# Patient Record
Sex: Male | Born: 1985 | Race: Black or African American | Hispanic: Yes | Marital: Single
Health system: Southern US, Community
[De-identification: ages and names within clinical notes are randomized; demographics above are authoritative.]

## PROBLEM LIST (undated history)

## (undated) HISTORY — PX: HERNIA REPAIR: SHX51

---

## 2019-07-13 ENCOUNTER — Encounter (HOSPITAL_COMMUNITY): Payer: Self-pay

## 2019-07-13 ENCOUNTER — Emergency Department (HOSPITAL_COMMUNITY)
Admission: EM | Admit: 2019-07-13 | Discharge: 2019-07-15 | Disposition: A | Discharge status: Home / Self Care | Payer: Self-pay | Attending: Emergency Medicine | Admitting: Emergency Medicine

## 2019-07-13 ENCOUNTER — Emergency Department (HOSPITAL_COMMUNITY): Payer: Self-pay

## 2019-07-13 ENCOUNTER — Other Ambulatory Visit: Payer: Self-pay

## 2019-07-13 ENCOUNTER — Emergency Department (HOSPITAL_COMMUNITY)
Admission: EM | Admit: 2019-07-13 | Discharge: 2019-07-13 | Discharge status: Against Medical Advice | Payer: Self-pay | Attending: Emergency Medicine | Admitting: Emergency Medicine

## 2019-07-13 DIAGNOSIS — F1994 Other psychoactive substance use, unspecified with psychoactive substance-induced mood disorder: Secondary | ICD-10-CM | POA: Insufficient documentation

## 2019-07-13 DIAGNOSIS — Z5320 Procedure and treatment not carried out because of patient's decision for unspecified reasons: Secondary | ICD-10-CM | POA: Insufficient documentation

## 2019-07-13 DIAGNOSIS — F19959 Other psychoactive substance use, unspecified with psychoactive substance-induced psychotic disorder, unspecified: Secondary | ICD-10-CM

## 2019-07-13 DIAGNOSIS — N179 Acute kidney failure, unspecified: Secondary | ICD-10-CM | POA: Insufficient documentation

## 2019-07-13 DIAGNOSIS — Z046 Encounter for general psychiatric examination, requested by authority: Secondary | ICD-10-CM | POA: Insufficient documentation

## 2019-07-13 DIAGNOSIS — R443 Hallucinations, unspecified: Secondary | ICD-10-CM | POA: Insufficient documentation

## 2019-07-13 DIAGNOSIS — Z20828 Contact with and (suspected) exposure to other viral communicable diseases: Secondary | ICD-10-CM | POA: Insufficient documentation

## 2019-07-13 DIAGNOSIS — F919 Conduct disorder, unspecified: Secondary | ICD-10-CM

## 2019-07-13 DIAGNOSIS — L02512 Cutaneous abscess of left hand: Secondary | ICD-10-CM | POA: Insufficient documentation

## 2019-07-13 DIAGNOSIS — F1910 Other psychoactive substance abuse, uncomplicated: Secondary | ICD-10-CM

## 2019-07-13 DIAGNOSIS — R509 Fever, unspecified: Secondary | ICD-10-CM | POA: Insufficient documentation

## 2019-07-13 DIAGNOSIS — F15259 Other stimulant dependence with stimulant-induced psychotic disorder, unspecified: Secondary | ICD-10-CM | POA: Insufficient documentation

## 2019-07-13 DIAGNOSIS — F1510 Other stimulant abuse, uncomplicated: Secondary | ICD-10-CM

## 2019-07-13 DIAGNOSIS — R451 Restlessness and agitation: Secondary | ICD-10-CM

## 2019-07-13 LAB — CBC WITH DIFFERENTIAL/PLATELET
Abs Immature Granulocytes: 0.09 10*3/uL — ABNORMAL HIGH (ref 0.00–0.07)
Basophils Absolute: 0 10*3/uL (ref 0.0–0.1)
Basophils Relative: 0 %
Eosinophils Absolute: 0 10*3/uL (ref 0.0–0.5)
Eosinophils Relative: 0 %
HCT: 36.2 % — ABNORMAL LOW (ref 39.0–52.0)
Hemoglobin: 12 g/dL — ABNORMAL LOW (ref 13.0–17.0)
Immature Granulocytes: 1 %
Lymphocytes Relative: 3 %
Lymphs Abs: 0.4 10*3/uL — ABNORMAL LOW (ref 0.7–4.0)
MCH: 32.7 pg (ref 26.0–34.0)
MCHC: 33.1 g/dL (ref 30.0–36.0)
MCV: 98.6 fL (ref 80.0–100.0)
Monocytes Absolute: 0.5 10*3/uL (ref 0.1–1.0)
Monocytes Relative: 4 %
Neutro Abs: 13 10*3/uL — ABNORMAL HIGH (ref 1.7–7.7)
Neutrophils Relative %: 92 %
Platelets: 249 10*3/uL (ref 150–400)
RBC: 3.67 MIL/uL — ABNORMAL LOW (ref 4.22–5.81)
RDW: 14.9 % (ref 11.5–15.5)
WBC: 14.1 10*3/uL — ABNORMAL HIGH (ref 4.0–10.5)
nRBC: 0 % (ref 0.0–0.2)

## 2019-07-13 LAB — COMPREHENSIVE METABOLIC PANEL
ALT: 42 U/L (ref 0–44)
AST: 117 U/L — ABNORMAL HIGH (ref 15–41)
Albumin: 4.8 g/dL (ref 3.5–5.0)
Alkaline Phosphatase: 55 U/L (ref 38–126)
Anion gap: 15 (ref 5–15)
BUN: 28 mg/dL — ABNORMAL HIGH (ref 6–20)
CO2: 20 mmol/L — ABNORMAL LOW (ref 22–32)
Calcium: 9.6 mg/dL (ref 8.9–10.3)
Chloride: 103 mmol/L (ref 98–111)
Creatinine, Ser: 2.39 mg/dL — ABNORMAL HIGH (ref 0.61–1.24)
GFR calc Af Amer: 40 mL/min — ABNORMAL LOW (ref >60)
GFR calc non Af Amer: 34 mL/min — ABNORMAL LOW (ref >60)
Glucose, Bld: 103 mg/dL — ABNORMAL HIGH (ref 70–99)
Potassium: 3.7 mmol/L (ref 3.5–5.1)
Sodium: 138 mmol/L (ref 135–145)
Total Bilirubin: 0.9 mg/dL (ref 0.3–1.2)
Total Protein: 8.6 g/dL — ABNORMAL HIGH (ref 6.5–8.1)

## 2019-07-13 LAB — ETHANOL: Alcohol, Ethyl (B): <10 mg/dL (ref <10)

## 2019-07-13 LAB — ACETAMINOPHEN LEVEL: Acetaminophen (Tylenol), Serum: <10 ug/mL — ABNORMAL LOW (ref 10–30)

## 2019-07-13 LAB — SALICYLATE LEVEL: Salicylate Lvl: <7 mg/dL (ref 2.8–30.0)

## 2019-07-13 MED ORDER — LORAZEPAM 2 MG/ML IJ SOLN
INTRAMUSCULAR | Status: AC
  Filled 2019-07-13: qty 1

## 2019-07-13 MED ORDER — LORAZEPAM 2 MG/ML IJ SOLN
1.0000 mg | Freq: Once | INTRAMUSCULAR | Status: AC
  Administered 2019-07-13: 1 mg via INTRAVENOUS
  Filled 2019-07-13: qty 1

## 2019-07-13 MED ORDER — SODIUM CHLORIDE 0.9 % IV BOLUS
1000.0000 mL | Freq: Once | INTRAVENOUS | Status: AC
  Administered 2019-07-13: 1000 mL via INTRAVENOUS

## 2019-07-13 MED ORDER — STERILE WATER FOR INJECTION IJ SOLN
INTRAMUSCULAR | Status: AC
  Filled 2019-07-13: qty 10

## 2019-07-13 MED ORDER — ZIPRASIDONE MESYLATE 20 MG IM SOLR
10.0000 mg | Freq: Once | INTRAMUSCULAR | Status: AC
  Administered 2019-07-13: 10 mg via INTRAMUSCULAR
  Filled 2019-07-13: qty 20

## 2019-07-13 NOTE — ED Notes (Signed)
Pt given lunch tray at this time

## 2019-07-13 NOTE — Discharge Instructions (Addendum)
Your history and exam today were overall reassuring aside from the hallucinations we feel you are having due to your meth use.  After you are able to eat and drink and were feeling better, we feel you are safe for discharge home.  We offered you to go see the psychiatry across the street but she did not want to do so.  Please follow-up with the outpatient psychiatry team and return if any symptoms change or worsen.
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
## 2019-07-13 NOTE — ED Notes (Signed)
TTS machine in room ?

## 2019-07-13 NOTE — [facility] Counselor (Addendum)
Contacted [facility] to initiate telepsych  @1238 - Telepsych not set up. Contacted [facility] ED secretary who states someone will put machine in room.  @1255 - Continue to be unable to reach telepsych.  @1300 - Continue to be unable to reach telepsych. Please contact [facility] when ready for assessment. TTS to go to next patient.

## 2019-07-13 NOTE — ED Notes (Addendum)
Pt refusing chest XR saying "fuck no. Get that machine away from" RN explained purpose of exam and he said "fuck no. I'll blow up your ass"

## 2019-07-13 NOTE — ED Notes (Signed)
Pt made contact with his girlfriend by phone so they are aware of his location. He is from [place] and came down here Friday night with a friend.
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
## 2019-07-13 NOTE — BH Assessment (Addendum)
Shuvon Rankin, NP recommends patient be transferred to [facility] Observation unit and started on Zyprexa 5 mg qhs. Agricultural consultant, Optician, dispensing informed.  [facility] will not IVC patient if he wants to sign out AMA as he does not meet criteria.  Per Nicoletta Dress, Observation Unit RN patient may come after 1600. Accepting Provider is Earleen Newport, NP. Please call report to [phone].

## 2019-07-13 NOTE — ED Notes (Signed)
Pt refuses to provide urine. He is uncooperative and cursing at staff.

## 2019-07-13 NOTE — ED Notes (Signed)
Staff attempted to get urine from pt. Pt not cooperating. Pt yelling and cursing at staff that he cannot give it without water.

## 2019-07-13 NOTE — ED Notes (Addendum)
Pt says to RN "I am never doing meth again this is crazy". Pt very is very jumpy and anxious, but is easily directed. Pt scratches at arms so a blanket was put over him.  RN put patient on monitor and tool vitals. Will continue to monitor.

## 2019-07-13 NOTE — ED Notes (Signed)
Attempted to start IVF on pt. Pt began yelling, "fuck off bitch, don't hook me up to anything." Pt then bit into IV tubing. Will attempt to hook pt up to fluids once pt calms down.
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
## 2019-07-13 NOTE — ED Notes (Signed)
Pt yelled at staff "just shut up and listen. You don't know shit. Fuck. Do you want to die." Pt becoming more agitated and yelling at staff. Multiple staff and [employer] attempted to use deescalating techniques without success.
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
## 2019-07-13 NOTE — BH Assessment (Signed)
Tele Assessment Note   Patient Name: Geoffrey Walters MRN: [id number] Referring Physician: Tegeler Location of Patient: [facility] ED Location of Provider: Behavioral Health TTS Department  Brando Taves is an 33 y.o. male presenting to [facility] ED via EMS after police called out because patient trying to get into houses and cars. He is a poor historian due to AMS. Patient reports using methamphetamine. Upon this clinician's exam patient states he is in the hospital because "I did too much drugs." He reports using methamphetamine for 2 days and has not slept. He denies any other substance use. He denies SI/HI. He denies AVH when not on meth. During assessment process patient states that his father is in the room with him and will take him home. Patient is alone in room. He denies any outpatient mental health services or prior hospitalizations. He states that he lives in [place] and attends NA and AA meetings there. No labs were collected on patient so it unclear if he is under the influence of any other substances.   Patient is alert and oriented x 3. He is laying in hospital bed and appears bizarre. His speech is rapid and pressured, his eye contact is fair, and his thoughts are somewhat disorganized. His mood is anxious and his affect is congruent. He has poor insight, judgement, and impulse control. He appears acutely psychotic.  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 No pertinent past medical history.  Past Surgical History:  Procedure Laterality Date  . HERNIA REPAIR      Family History: No family history on file.  Social History:  reports current drug use. Drug: Methamphetamines. No history on file for tobacco and alcohol.  Additional Social History:  Alcohol / Drug Use Pain Medications: see MAR Prescriptions: see MAR Over the Counter: see MAR History of alcohol / drug use?: Yes Substance #1 Name of Substance 1:  Methamphetamine 1 - Age of First Use: UTA 1 - Amount (size/oz): UTA 1 - Frequency: UTA 1 - Duration: UTA 1 - Last Use / Amount: 07/13/2019 AM  CIWA: CIWA-Ar BP: 133/85 Pulse Rate: (!) 102 COWS:    Allergies: No Known Allergies  Home Medications: (Not in a hospital admission)   OB/GYN Status:  No LMP for male patient.  General Assessment Data Assessment unable to be completed: Yes Reason for not completing assessment: (counselor made multiple attempts to set up telepsych) Location of Assessment: [facility] TTS Assessment: In system Is this a Tele or Face-to-Face Assessment?: Tele Assessment Is this an Initial Assessment or a Re-assessment for this encounter?: Initial Assessment Patient Accompanied by:: N/A Language Other than English: No Living Arrangements: (in [place]) What gender do you identify as?: Male Marital status: Single Maiden name: Harju Pregnancy Status: No Living Arrangements: Alone Can pt return to current living arrangement?: Yes Admission Status: Voluntary Is patient capable of signing voluntary admission?: Yes Referral Source: Self/Family/Friend Insurance type: None     Crisis Care Plan Living Arrangements: Alone Legal Guardian: (self) Name of Psychiatrist: none Name of Therapist: none  Education Status Is patient currently in school?: No Is the patient employed, unemployed or receiving disability?: (UTA)  Risk to self with the past 6 months Suicidal Ideation: No Has patient been a risk to self within the past 6 months prior to admission? : No Suicidal Intent: No Has patient had any suicidal intent within the past 6 months prior to admission? : No Is patient at risk for suicide?: No Suicidal  Plan?: No Has patient had any suicidal plan within the past 6 months prior to admission? : No Access to Means: No What has been your use of drugs/alcohol within the last 12 months?: methamphetamine Previous Attempts/Gestures: No How many times?:  0 Other Self Harm Risks: none Triggers for Past Attempts: None known Intentional Self Injurious Behavior: None Family Suicide History: No Recent stressful life event(s): (none noted) Persecutory voices/beliefs?: No Depression: Yes Depression Symptoms: Insomnia, Feeling angry/irritable, Feeling worthless/self pity, Isolating Substance abuse history and/or treatment for substance abuse?: No Suicide prevention information given to non-admitted patients: Not applicable  Risk to Others within the past 6 months Homicidal Ideation: No Does patient have any lifetime risk of violence toward others beyond the six months prior to admission? : No Thoughts of Harm to Others: No Current Homicidal Intent: No Current Homicidal Plan: No Access to Homicidal Means: No Identified Victim: none History of harm to others?: No Assessment of Violence: None Noted Violent Behavior Description: none Does patient have access to weapons?: No Criminal Charges Pending?: No Does patient have a court date: No Is patient on probation?: No  Psychosis Hallucinations: Visual Delusions: None noted  Mental Status Report Appearance/Hygiene: In scrubs Eye Contact: Fair Motor Activity: Freedom of movement Speech: Rapid, Pressured Level of Consciousness: Alert Mood: Irritable Affect: Irritable Anxiety Level: Moderate Thought Processes: Circumstantial Judgement: Impaired Orientation: Person, Time, Place, Situation Obsessive Compulsive Thoughts/Behaviors: None  Cognitive Functioning Concentration: Normal Memory: Recent Intact, Remote Intact Is patient IDD: No Insight: Poor Impulse Control: Poor Appetite: Poor Have you had any weight changes? : No Change Sleep: Decreased Total Hours of Sleep: 0 Vegetative Symptoms: None  ADLScreening [facility]) Patient's cognitive ability adequate to safely complete daily activities?: Yes Patient able to express need for assistance with ADLs?:  Yes Independently performs ADLs?: Yes (appropriate for developmental age)  Prior Inpatient Therapy Prior Inpatient Therapy: No  Prior Outpatient Therapy Prior Outpatient Therapy: No Does patient have an ACCT team?: No Does patient have Intensive In-House Services?  : No Does patient have [facility] services? : No Does patient have P4CC services?: No  ADL Screening (condition at time of admission) Patient's cognitive ability adequate to safely complete daily activities?: Yes Is the patient deaf or have difficulty hearing?: No Does the patient have difficulty seeing, even when wearing glasses/contacts?: No Does the patient have difficulty concentrating, remembering, or making decisions?: No Patient able to express need for assistance with ADLs?: Yes Does the patient have difficulty dressing or bathing?: No Independently performs ADLs?: Yes (appropriate for developmental age) Does the patient have difficulty walking or climbing stairs?: No Weakness of Legs: None Weakness of Arms/Hands: None  Home Assistive Devices/Equipment Home Assistive Devices/Equipment: None  Therapy Consults (therapy consults require a physician order) PT Evaluation Needed: No OT Evalulation Needed: No SLP Evaluation Needed: No Abuse/Neglect Assessment (Assessment to be complete while patient is alone) Abuse/Neglect Assessment Can Be Completed: Unable to assess, patient is non-responsive or altered mental status Values / Beliefs Cultural Requests During Hospitalization: None Spiritual Requests During Hospitalization: None Consults Spiritual Care Consult Needed: No Social Work Consult Needed: No Regulatory affairs officer (For Healthcare) Does Patient Have a Medical Advance Directive?: Unable to assess, patient is non-responsive or altered mental status Would patient like information on creating a medical advance directive?: No - Patient declined          Disposition: Shuvon Rankin, NP recommends patient be  observed overnight of safety and stabilization and started in 5 mg Zyprexa qhs. Agricultural consultant  informed. Patient may come to observation unit after 1600 pending negative Covid. Disposition Initial Assessment Completed for this Encounter: Yes  This service was provided via telemedicine using a 2-way, interactive audio and video technology.  Names of all persons participating in this telemedicine service and their role in this encounter. Name: Wandra MannanBrandon Ceesay Role: patient  Name: Celedonio MiyamotoMeredith Shaquile Lutze, LCSW Role: TTS  Name:  Role:   Name:  Role:     Celedonio MiyamotoMeredith  Stacy Deshler 07/13/2019 2:20 PM

## 2019-07-13 NOTE — ED Triage Notes (Signed)
Patient arrived via [facility] after [employer] called out for someone trying to get into a house/cars. Patient reports smoking meth and confused about date and location. Reports some hallucinations. Denies SI or HI.

## 2019-07-13 NOTE — ED Notes (Signed)
Pt left AMA.  Pt alert and ambulatory without difficulty.

## 2019-07-14 ENCOUNTER — Emergency Department (HOSPITAL_COMMUNITY): Payer: Self-pay

## 2019-07-14 ENCOUNTER — Other Ambulatory Visit: Payer: Self-pay

## 2019-07-14 ENCOUNTER — Encounter (HOSPITAL_COMMUNITY): Payer: Self-pay | Admitting: Behavioral Health

## 2019-07-14 DIAGNOSIS — F19959 Other psychoactive substance use, unspecified with psychoactive substance-induced psychotic disorder, unspecified: Secondary | ICD-10-CM

## 2019-07-14 LAB — CBC WITH DIFFERENTIAL/PLATELET
Abs Immature Granulocytes: 0.04 10*3/uL (ref 0.00–0.07)
Basophils Absolute: 0 10*3/uL (ref 0.0–0.1)
Basophils Relative: 0 %
Eosinophils Absolute: 0 10*3/uL (ref 0.0–0.5)
Eosinophils Relative: 0 %
HCT: 30.6 % — ABNORMAL LOW (ref 39.0–52.0)
Hemoglobin: 9.9 g/dL — ABNORMAL LOW (ref 13.0–17.0)
Immature Granulocytes: 1 %
Lymphocytes Relative: 6 %
Lymphs Abs: 0.5 10*3/uL — ABNORMAL LOW (ref 0.7–4.0)
MCH: 32.5 pg (ref 26.0–34.0)
MCHC: 32.4 g/dL (ref 30.0–36.0)
MCV: 100.3 fL — ABNORMAL HIGH (ref 80.0–100.0)
Monocytes Absolute: 0.5 10*3/uL (ref 0.1–1.0)
Monocytes Relative: 5 %
Neutro Abs: 7.8 10*3/uL — ABNORMAL HIGH (ref 1.7–7.7)
Neutrophils Relative %: 88 %
Platelets: 174 10*3/uL (ref 150–400)
RBC: 3.05 MIL/uL — ABNORMAL LOW (ref 4.22–5.81)
RDW: 15.2 % (ref 11.5–15.5)
WBC: 8.8 10*3/uL (ref 4.0–10.5)
nRBC: 0 % (ref 0.0–0.2)

## 2019-07-14 LAB — CK
Total CK: 4909 U/L — ABNORMAL HIGH (ref 49–397)
Total CK: 3898 U/L — ABNORMAL HIGH (ref 49–397)
Total CK: 2264 U/L — ABNORMAL HIGH (ref 49–397)

## 2019-07-14 LAB — RAPID URINE DRUG SCREEN, HOSP PERFORMED
Amphetamines: POSITIVE — AB
Barbiturates: NOT DETECTED
Benzodiazepines: NOT DETECTED
Cocaine: POSITIVE — AB
Opiates: NOT DETECTED
Tetrahydrocannabinol: POSITIVE — AB

## 2019-07-14 LAB — BASIC METABOLIC PANEL
Anion gap: 9 (ref 5–15)
BUN: 22 mg/dL — ABNORMAL HIGH (ref 6–20)
CO2: 20 mmol/L — ABNORMAL LOW (ref 22–32)
Calcium: 8.1 mg/dL — ABNORMAL LOW (ref 8.9–10.3)
Chloride: 109 mmol/L (ref 98–111)
Creatinine, Ser: 1.53 mg/dL — ABNORMAL HIGH (ref 0.61–1.24)
GFR calc Af Amer: >60 mL/min (ref >60)
GFR calc non Af Amer: 58 mL/min — ABNORMAL LOW (ref >60)
Glucose, Bld: 83 mg/dL (ref 70–99)
Potassium: 3.7 mmol/L (ref 3.5–5.1)
Sodium: 138 mmol/L (ref 135–145)

## 2019-07-14 LAB — COMPREHENSIVE METABOLIC PANEL
ALT: 36 U/L (ref 0–44)
AST: 71 U/L — ABNORMAL HIGH (ref 15–41)
Albumin: 3.6 g/dL (ref 3.5–5.0)
Alkaline Phosphatase: 43 U/L (ref 38–126)
Anion gap: 10 (ref 5–15)
BUN: 15 mg/dL (ref 6–20)
CO2: 21 mmol/L — ABNORMAL LOW (ref 22–32)
Calcium: 8.3 mg/dL — ABNORMAL LOW (ref 8.9–10.3)
Chloride: 106 mmol/L (ref 98–111)
Creatinine, Ser: 1.18 mg/dL (ref 0.61–1.24)
GFR calc Af Amer: >60 mL/min (ref >60)
GFR calc non Af Amer: >60 mL/min (ref >60)
Glucose, Bld: 113 mg/dL — ABNORMAL HIGH (ref 70–99)
Potassium: 3.5 mmol/L (ref 3.5–5.1)
Sodium: 137 mmol/L (ref 135–145)
Total Bilirubin: 0.4 mg/dL (ref 0.3–1.2)
Total Protein: 6.8 g/dL (ref 6.5–8.1)

## 2019-07-14 LAB — URINALYSIS, ROUTINE W REFLEX MICROSCOPIC: RBC / HPF: >50 RBC/hpf — ABNORMAL HIGH (ref 0–5)

## 2019-07-14 LAB — SARS CORONAVIRUS 2 (TAT 6-24 HRS): SARS Coronavirus 2: NEGATIVE

## 2019-07-14 LAB — APTT: aPTT: 28 seconds (ref 24–36)

## 2019-07-14 LAB — PROTIME-INR
INR: 1 (ref 0.8–1.2)
Prothrombin Time: 13.5 seconds (ref 11.4–15.2)

## 2019-07-14 LAB — LACTIC ACID, PLASMA: Lactic Acid, Venous: 0.9 mmol/L (ref 0.5–1.9)

## 2019-07-14 MED ORDER — SODIUM CHLORIDE 0.9 % IV BOLUS
1000.0000 mL | Freq: Once | INTRAVENOUS | Status: AC
  Administered 2019-07-14: 1000 mL via INTRAVENOUS

## 2019-07-14 MED ORDER — SODIUM CHLORIDE 0.9 % IV BOLUS (SEPSIS)
1000.0000 mL | Freq: Once | INTRAVENOUS | Status: AC
  Administered 2019-07-14: 1000 mL via INTRAVENOUS

## 2019-07-14 MED ORDER — SODIUM CHLORIDE 0.9 % IV SOLN
INTRAVENOUS | Status: DC
  Administered 2019-07-14: 04:00:00 via INTRAVENOUS

## 2019-07-14 MED ORDER — ACETAMINOPHEN 325 MG PO TABS
650.0000 mg | ORAL_TABLET | Freq: Once | ORAL | Status: AC
  Administered 2019-07-14: 650 mg via ORAL
  Filled 2019-07-14: qty 2

## 2019-07-14 MED ORDER — LORAZEPAM 2 MG/ML IJ SOLN
1.0000 mg | Freq: Once | INTRAMUSCULAR | Status: AC
  Administered 2019-07-14: 1 mg via INTRAVENOUS
  Filled 2019-07-14: qty 1

## 2019-07-14 NOTE — BHH Suicide Risk Assessment (Cosign Needed)
Suicide Risk Assessment  Discharge Assessment   [facility] Discharge Suicide Risk Assessment   Principal Problem: Psychoactive substance-induced psychosis ([place]) Discharge Diagnoses: Principal Problem:   Psychoactive substance-induced psychosis ([place])   Total Time spent with patient: 15 minutes  Musculoskeletal: Strength & Muscle Tone: within normal limits Gait & Station: normal Patient leans: N/A  Psychiatric Specialty Exam:   Blood pressure (!) 133/96, pulse (!) 105, temperature 99.3 F (37.4 C), resp. rate 16, SpO2 97 %.There is no height or weight on file to calculate BMI.  General Appearance: Casual  Eye Contact::  Good  Speech:  Clear and Coherent and Normal Rate409  Volume:  Normal  Mood:  Anxious  Affect:  Appropriate and Congruent  Thought Process:  Coherent, Goal Directed and Descriptions of Associations: Intact  Orientation:  Full (Time, Place, and Person)  Thought Content:  WDL  Suicidal Thoughts:  No  Homicidal Thoughts:  No  Memory:  Immediate;   Good Recent;   Good  Judgement:  Intact  Insight:  Present  Psychomotor Activity:  Normal  Concentration:  Good  Recall:  Good  Fund of Knowledge:Good  Language: Good  Akathisia:  No  Handed:  Right  AIMS (if indicated):     Assets:  Communication Skills Desire for Improvement Housing Social Support  Sleep:     Cognition: WNL  ADL's:  Intact   Mental Status Per Nursing Assessment::   On Admission:    Geoffrey Walters, 33 y.o., male patient seen via tele psych by this provider, Dr. Dwyane Dee; and chart reviewed on 07/14/19.  On evaluation Geoffrey Walters reports he came to [facility] from [place] last Friday.  States he "hooked up" with his father.  Patient admits to substance use and having some confusion.  Reports that he is feeling better today and that he is aware that he is in hospital in [facility].  Patient was also able to give the correct month and year.  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  At this time patient denies suicidal/self-harm/homicidal ideations, psychosis, and paranoia.  Reports he needs to get back home.  Patient also denies prior psychiatric history.  Collateral information was gathered from mother by TTS who reports that patient has no psychiatric history and that patient only asked his way when he is on drugs meaning(like he is not making any sense when he is on drugs). During evaluation Geoffrey Walters is alert/oriented x 4; calm/cooperative; and mood is congruent with affect.  He does not appear to be responding to internal/external stimuli or delusional thoughts.  Patient denies suicidal/self-harm/homicidal ideation, psychosis, and paranoia.  Patient answered question appropriately.     Demographic Factors:  Male  Loss Factors: NA  Historical Factors: Impulsivity  Risk Reduction Factors:   Sense of responsibility to family, Religious beliefs about death, Living with another person, especially a relative and Positive social support  Continued Clinical Symptoms:  Alcohol/Substance Abuse/Dependencies  Cognitive Features That Contribute To Risk:  None    Suicide Risk:  Minimal: No identifiable suicidal ideation.  Patients presenting with no risk factors but with morbid ruminations; may be classified as minimal risk based on the severity of the depressive symptoms     Discharge Instructions     For your behavioral health needs, you are advised to follow up with [facility] at your earliest opportunity.  Please note that the phone number listed below also serves as a 24 hour crisis number:  [facility]      [phone]      Plan Of Care/Follow-up recommendations:  Activity:  As tolerated Diet:  Heart healthy  Geoffrey Byington, NP 07/14/2019, 3:11 PM

## 2019-07-14 NOTE — ED Notes (Signed)
Pt to room 43. Pt cooperative, calm, no s/s of distress.  Pt oriented to unit. Pt resting in bed.

## 2019-07-14 NOTE — ED Notes (Signed)
Pt changed out of clothes into a gown. Pt will follow commands after several request.  Pt states he is in no pain, just wants to sleep. Pt provided with 2 warm blanket. Resting on right side.

## 2019-07-14 NOTE — Discharge Instructions (Addendum)
For your behavioral health needs, you are advised to follow up with [facility] at your earliest opportunity.  Please note that the phone number listed below also serves as a 24 hour crisis number:       [facility]      [phone]

## 2019-07-14 NOTE — BH Assessment (Signed)
[place] Assessment Progress Note  Per Hampton Abbot, MD, this pt does not require psychiatric hospitalization at this time.  Pt presents under IVC initiated by pt's mother which Dr Dwyane Dee has rescinded.  Pt is to be discharged from [facility] with recommendation to follow up with [facility] in [facility], pt's community of residence.  This has been included in pt's discharge instructions.  Pt would also benefit from seeing Peer Support Specialists; they will be asked to speak to pt.  This Probation officer will ask [place], CSW to assist with pt's transportation back home.  Pt would also benefit from seeing Peer Support Specialists; they will be asked to speak to pt.  Pt's nurse, Eustaquio Maize, has been notified.  Jalene Mullet, [place] Triage Specialist [phone]

## 2019-07-14 NOTE — BH Assessment (Signed)
[place] Assessment Progress Note      Per Shuvon Rankin, NP, patient will need to be medically monitored and medically cleared.  Over night observation is recommended and patient will be reassessed in the morning.

## 2019-07-14 NOTE — Patient Outreach (Signed)
ED Peer Support Specialist Patient Intake (Complete at intake & 30-60 Day Follow-up)  Name: Eliyahu Bille  MRN: [id number]  Age: 33 y.o.   Date of Admission: 07/14/2019  Intake: Initial Comments:      Primary Reason Admitted: Drug Problem, Psychiatric Evaluation   Lab values: Alcohol/ETOH: Negative Positive UDS? Yes Amphetamines: No Barbiturates: No Benzodiazepines: No Cocaine: No Opiates: No Cannabinoids: No  Demographic information: Gender: Male Ethnicity: Other (comment) Marital Status: Single Insurance Status: Uninsured/Self-pay Ecologist (Work Neurosurgeon, Physicist, medical, etc.: No Lives with: Partner/Spouse Living situation: House/Apartment  Reported Patient History: Patient reported health conditions: None Patient aware of HIV and hepatitis status: No  In past year, has patient visited ED for any reason? Yes  Number of ED visits: 2  Reason(s) for visit:    In past year, has patient been hospitalized for any reason?    Number of hospitalizations:    Reason(s) for hospitalization:    In past year, has patient been arrested?    Number of arrests:    Reason(s) for arrest:    In past year, has patient been incarcerated?    Number of incarcerations:    Reason(s) for incarceration:    In past year, has patient received medication-assisted treatment? No  In past year, patient received the following treatments: Other (comment)  In past year, has patient received any harm reduction services? No  Did this include any of the following?    In past year, has patient received care from a mental health provider for diagnosis other than SUD? No  In past year, is this first time patient has overdosed? Yes  Number of past overdoses:    In past year, is this first time patient has been hospitalized for an overdose? No  Number of hospitalizations for overdose(s): 1  Is patient currently receiving treatment for a mental health  diagnosis? No  Patient reports experiencing difficulty participating in SUD treatment: No    Most important reason(s) for this difficulty?    Has patient received prior services for treatment? No  In past, patient has received services from following agencies:    Plan of Care:  Suggested follow up at these agencies/treatment centers: Other (comment)  Other information: CPSS met with Pt an was able to complete series of questions needed to better assist Pt. CPSS was made aware that he had used to much an that he is coming down an feels better. Pt addressed the fact that he does want to attend a treatment facility and that he wants to do so in [place] area. Pt stated that he has a girlfriend that will be his support system when he get discharged. CPSS is leaving Pt information about [facility] a facility in [place] that Pt may benefit from utilizing there services, Pt also is getting a list of free substance abuse facilities in [place] as well.   Aaron Edelman Omaira Mellen, CPSS  07/14/2019 3:20 PM

## 2019-07-14 NOTE — ED Notes (Signed)
Pt standing at the end of bed urinating bloody urine in the trash can. Urine sample was collected from condom cath, pt removed condom cath. EDP made aware.

## 2019-07-14 NOTE — ED Notes (Signed)
Pt had elevated VS ( see flowsheet)  Dr Ralene Bathe notified and pt was assessed by by Dr Ralene Bathe.  Dr advised medical work up (see orders). Pt to room 15.

## 2019-07-14 NOTE — ED Notes (Signed)
Clinician attempted TTS consult. Per Verline Lema, RN, patient given geodon and ativan due to agitation. Patient unable to complete assessment.

## 2019-07-14 NOTE — ED Notes (Signed)
Pt continues to yell at staff and [employer] about a bomb in the room. Attempted to redirect pt and deescalate situation, attempt unsuccessful.

## 2019-07-14 NOTE — BH Assessment (Signed)
Tele Assessment Note   Patient Name: Geoffrey Walters MRN: [id number] Referring Physician: Cyril Mourning Ward Location of Patient: [facility] Location of Provider: [place] Department  Per Orvis Brill Assessment 07/13/2019: Geoffrey Walters is an 33 y.o. male presenting to [facility] ED via EMS after police called out because patient trying to get into houses and cars. He is a poor historian due to [place]. Patient reports using methamphetamine. Upon this clinician's exam patient states he is in the hospital because "I did too much drugs." He reports using methamphetamine for 2 days and has not slept. He denies any other substance use. He denies SI/HI. He denies AVH when not on meth. During assessment process patient states that his father is in the room with him and will take him home. Patient is alone in room. He denies any outpatient mental health services or prior hospitalizations. He states that he lives in [place] and attends NA and AA meetings there. No labs were collected on patient so it unclear if he is under the influence of any other substances.   Patient is alert and oriented x 3. He is laying in hospital bed and appears bizarre. His speech is rapid and pressured, his eye contact is fair, and his thoughts are somewhat disorganized. His mood is anxious and his affect is congruent. He has poor insight, judgement, and impulse control. He appears acutely psychotic.  TTS 07/14/2019:  TTS spoke to patient's mother, Geoffrey Walters, [phone], who states that patient lives in [place] and came to [place] with a friend.  She states that when it was time to leave that the friend could not find the patient and left without them.  Mother states that patient has an alcohol problem and states that she was told that he was using methamphetamine.  Mother states that patient has no history of mental illness or psychiatric treatment in the past.  She states that he has never been suicidal, homicidal or psychotic  unless he is under the influence of drugs.  She states that patient's brother, his aunt, a close friend and his grandmother have all died in th past couple of years and she states that he has been struggling with these losses and she states that because of this that he has been drinking and using more.  She states that he has no hitory of abuse or self-mutilation.    Patient is unemployed and mother states that he cannot maintain employment because of him constantly being intoxicated.  She states that he has never been married, but states that he has four children.  She states that he lives with her and he has several girlfriends that she states that be bounces around from place.  TTS was unable to fully assess patient .  He appeared to continue to be psychotic and and his thoughts were very loose and hard to understand due to his psychosis.  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  Past Surgical History:  Procedure Laterality Date  . HERNIA REPAIR      Family History: No family history on file.  Social History:  reports current alcohol use. He reports current drug use. Drugs: Methamphetamines and Cocaine. No history on file for tobacco.  Additional Social History:  Alcohol / Drug Use Pain Medications: see MAR Prescriptions: see MAR Over the Counter: see MAR History of alcohol / drug use?: Yes(patient is currently psychotic and unable to identify drug use hx.  He tested positive for  THC, Cocaine, Methamphetamine) Longest period of sobriety (when/how long): none reported  CIWA: CIWA-Ar BP: (!) 133/96 Pulse Rate: (!) 105 COWS:    Allergies: No Known Allergies  Home Medications: (Not in a hospital admission)   OB/GYN Status:  No LMP for male patient.  General Assessment Data Assessment unable to be completed: Yes Reason for not completing assessment: (patient sedated) Location of Assessment: [facility] TTS  Assessment: In system Is this a Tele or Face-to-Face Assessment?: Tele Assessment Is this an Initial Assessment or a Re-assessment for this encounter?: Initial Assessment Patient Accompanied by:: N/A Language Other than English: No Living Arrangements: Other (Comment)(lives with mother and grilfriends "bounces around" ) What gender do you identify as?: Male Marital status: Single Living Arrangements: Parent, Non-relatives/Friends Can pt return to current living arrangement?: Yes Admission Status: Voluntary Is patient capable of signing voluntary admission?: Yes Referral Source: Self/Family/Friend Insurance type: self-pay     Crisis Care Plan Living Arrangements: Parent, Non-relatives/Friends Legal Guardian: Other:(self) Name of Psychiatrist: none Name of Therapist: none  Education Status Is patient currently in school?: No Is the patient employed, unemployed or receiving disability?: Unemployed  Risk to self with the past 6 months Suicidal Ideation: No Has patient been a risk to self within the past 6 months prior to admission? : No Suicidal Intent: No Has patient had any suicidal intent within the past 6 months prior to admission? : No Is patient at risk for suicide?: No Suicidal Plan?: No Has patient had any suicidal plan within the past 6 months prior to admission? : No Access to Means: No What has been your use of drugs/alcohol within the last 12 months?: uses almost daily Previous Attempts/Gestures: No How many times?: 0 Other Self Harm Risks: none Triggers for Past Attempts: None known Intentional Self Injurious Behavior: None Family Suicide History: No Recent stressful life event(s): (deaths of multiple family members and a friend) Persecutory voices/beliefs?: No Depression: Yes Depression Symptoms: Despondent, Insomnia, Isolating, Loss of interest in usual pleasures, Feeling worthless/self pity Substance abuse history and/or treatment for substance abuse?:  No Suicide prevention information given to non-admitted patients: Not applicable  Risk to Others within the past 6 months Homicidal Ideation: No Does patient have any lifetime risk of violence toward others beyond the six months prior to admission? : No Thoughts of Harm to Others: No Current Homicidal Intent: No Current Homicidal Plan: No Access to Homicidal Means: No Identified Victim: none History of harm to others?: No Assessment of Violence: None Noted Violent Behavior Description: none Does patient have access to weapons?: No Criminal Charges Pending?: No Does patient have a court date: No Is patient on probation?: No  Psychosis Hallucinations: Visual Delusions: (thinks he is in a bomb shelter)  Mental Status Report Appearance/Hygiene: Unremarkable Eye Contact: Poor Motor Activity: Unremarkable Speech: Unable to assess Level of Consciousness: Alert Mood: Irritable Affect: Irritable Anxiety Level: Moderate Thought Processes: Flight of Ideas Judgement: Impaired Orientation: Person Obsessive Compulsive Thoughts/Behaviors: Severe(concerning his drug use)  Cognitive Functioning Concentration: Poor Memory: Recent Impaired, Remote Impaired Is patient IDD: No Insight: Poor Impulse Control: Poor Appetite: Poor Have you had any weight changes? : No Change Sleep: Decreased Total Hours of Sleep: (not sleeping) Vegetative Symptoms: None  ADLScreening [facility] Assessment Services) Patient's cognitive ability adequate to safely complete daily activities?: Yes Patient able to express need for assistance with ADLs?: Yes Independently performs ADLs?: Yes (appropriate for developmental age)  Prior Inpatient Therapy Prior Inpatient Therapy: No  Prior Outpatient Therapy Prior Outpatient Therapy:  No Does patient have an ACCT team?: No Does patient have Intensive In-House Services?  : No Does patient have [facility] services? : No Does patient have P4CC services?: No  ADL  Screening (condition at time of admission) Patient's cognitive ability adequate to safely complete daily activities?: Yes Is the patient deaf or have difficulty hearing?: No Does the patient have difficulty seeing, even when wearing glasses/contacts?: No Does the patient have difficulty concentrating, remembering, or making decisions?: No Patient able to express need for assistance with ADLs?: Yes Does the patient have difficulty dressing or bathing?: No Independently performs ADLs?: Yes (appropriate for developmental age) Does the patient have difficulty walking or climbing stairs?: No Weakness of Legs: None Weakness of Arms/Hands: None  Home Assistive Devices/Equipment Home Assistive Devices/Equipment: None  Therapy Consults (therapy consults require a physician order) PT Evaluation Needed: No OT Evalulation Needed: No SLP Evaluation Needed: No Abuse/Neglect Assessment (Assessment to be complete while patient is alone) Abuse/Neglect Assessment Can Be Completed: Unable to assess, patient is non-responsive or altered mental status(mother states that patient has no history of abuse) Values / Beliefs Cultural Requests During Hospitalization: None Spiritual Requests During Hospitalization: None Consults Spiritual Care Consult Needed: No Social Work Consult Needed: No Merchant navy officer (For Healthcare) Does Patient Have a Medical Advance Directive?: No Would patient like information on creating a medical advance directive?: No - Patient declined Nutrition Screen- [facility] Adult[facility] Has the patient been eating poorly because of a decreased appetite?: No        Disposition: Per Shuvon Rankin, NP, patient will need to be medically monitored and medically cleared.  Over night observation is recommended and patient will be reassessed in the morning. Disposition Initial Assessment Completed for this Encounter: Yes  This service was provided via telemedicine using a 2-way, interactive  audio and video technology.  Names of all persons participating in this telemedicine service and their role in this encounter. Name: Geoffrey Walters Role: patient  Name: Dannielle Huh Gilverto Dileonardo Role: TTS  Name: Geoffrey Walters Role: patient's mother  Name:  Role:     Daphene Calamity 07/14/2019 11:17 AM

## 2019-07-14 NOTE — Progress Notes (Signed)
CSW spoke with patient at bedside to address his transportation needs. Patient reports he came from [place] after catching a ride someone who has since left. Patient reports his mother is willing to come get him and get him back to [place]. Patient is to be discharged today. CSW encouraged patient to call his mother prior to being discharged to make arrangements. No other social work needs noted, [place] signing off.   Golden Circle, LCSW Transitions of Care Department [facility] ED [phone]

## 2019-07-14 NOTE — ED Notes (Signed)
Telepsych set up at bedside. 

## 2019-07-14 NOTE — ED Notes (Addendum)
TTS via telepsych set up at bedside

## 2019-07-14 NOTE — ED Notes (Signed)
Pt pulled ECG and pulse ox off. Pulse ox reapplied.

## 2019-07-14 NOTE — ED Notes (Signed)
Pt's mother called wanting an update on patient as she had to drive from [place] last night to take out IVC papers on patient. Informed mother that patient is resting at this time. Mother wanted to speak with MD. This RN spoke with DR Long and made him aware of patient and mother's request to speak with a provider, was told to let mother know that we are waiting on TTS recommendations.  Informed mother that we are waiting on TTS' recommendation of their evaluation, and would know more of plan of care in about an hour.  Mother asking if she could bring patient clean clothes. Informed mother that she was allowed to bring him clean clothes, but while he is here we provide him clothing, but he can wear the clothing she brings when he leaves.

## 2019-07-14 NOTE — ED Notes (Signed)
Pt resting in bed, lying on right side. Even and unlabored respirations. No distress noted.

## 2019-07-14 NOTE — ED Notes (Signed)
Attempted In &Out cath for urine sample. While performing task, met resistance, blood began to come out of catheter. Catheter removed from penis. Dr. Leonides Schanz made aware. Condom cath placed on pt.

## 2019-07-14 NOTE — ED Provider Notes (Signed)
1:00 AM  Assumed care from Dr. Ashok Cordia.  Patient is a 33 year old male with history of polysubstance abuse who brought in agitated, found on the side of the road.  Required Geodon and Ativan here.  Labs show acute kidney injury.  Urine, CK level pending.  Getting second liter of IV fluids.  Will repeat BMP in the morning.  1:35 AM  Pt's CK level elevated to 4900.  Plan will be to aggressively hydrate patient and recheck CK and BMP at 6 AM.  TTS has been consulted when patient is awake.  4:50 AM  Nurse attempted I&O catheterization.  Met resistance and then stopped.  Afterwards patient had frank blood coming from his urethra.  Suspect from traumatic injury.  Condom catheter placed on patient.  Patient has been able to urinate on his own.  Patient still has very bloody urine with small clots.  He has received 3 L of IV fluids.  Repeat labs pending.   5:45 AM  Pt's creatinine now 1.53 and CK is decreasing to now 3800.  Will continue hydration.  Drug screen positive for cocaine, amphetamines and THC.  Likely substance-induced psychosis but will be evaluated by TTS once awake.  I reviewed all nursing notes and pertinent previous records as available.  I have interpreted any EKGs, lab and urine results, imaging (as available).    EKG Interpretation  Date/Time:  Monday July 13 2019 22:04:40 EST Ventricular Rate:  120 PR Interval:    QRS Duration: 91 QT Interval:  354 QTC Calculation: 501 R Axis:   77 Text Interpretation: Sinus tachycardia Baseline wander Poor data quality Confirmed by Lajean Saver [phone]) on 07/13/2019 10:14:42 PM         Ward, Delice Bison, DO 07/14/19 [id number]

## 2019-07-14 NOTE — ED Notes (Addendum)
Per Long, MD patient is medically cleared and may go back to [facility].  Patient wanded by security.

## 2019-07-14 NOTE — ED Provider Notes (Signed)
Called to bedside for abnormal discharge vitals. On assessment patient is febrile and tachycardic. He states that he has been feeling hot and cold at times but otherwise has no acute complaints. Repeat labs were ordered as well as chest x-ray.  Labs with resolution of prior leukocytosis, improvement in his renal function. Chest x-ray without any evidence of pneumonia. On repeat assessment patient does report that he has had pain to his left thumb. He states that he cut it two weeks ago on a knife. He states that is been draining for the last couple of days to a week. He does have some spontaneous purulent drainage from the dorsal aspect of the left first IP joint. He is able to flex a joint but does have decreased range of motion. There is no ascending erythema, edema or tenderness. No evidence of osteomyelitis on x-ray. Plan to treat with antibiotics for spontaneously draining abscess. Presentation currently is not consistent with open joint with abscess. No current evidence of sepsis. Discussed importance of outpatient follow-up as well as return precautions.   Quintella Reichert, MD 07/15/19 [phone]

## 2019-07-14 NOTE — ED Notes (Signed)
Pt pulling off equipment. Equipment reapplied- BP, Pulse Ox and ECG leads.

## 2019-07-14 NOTE — Consult Note (Signed)
  Attempted to assess patient.  Calling telepsych machine and no answer.  Called [facility] charge [phone])  nurse informed that machine in patient room but no one answering.  Waited and recalled and still no answer.  Will attempt to telepsych at a later time today if available or tomorrow.     Shuvon B. Rankin, NP

## 2019-07-14 NOTE — ED Notes (Signed)
Pt yelling at staff as well as hallucinating there is a bomb in the room. Pt is repeatedly yelling, "turn me sideways from the bomb."

## 2019-07-15 LAB — URINALYSIS, ROUTINE W REFLEX MICROSCOPIC
Bilirubin Urine: NEGATIVE
Glucose, UA: NEGATIVE mg/dL
Ketones, ur: NEGATIVE mg/dL
Leukocytes,Ua: NEGATIVE
Nitrite: NEGATIVE
Protein, ur: NEGATIVE mg/dL
Specific Gravity, Urine: 1.011 (ref 1.005–1.030)
pH: 6 (ref 5.0–8.0)

## 2019-07-15 LAB — URINE CULTURE: Culture: NO GROWTH

## 2019-07-15 MED ORDER — CLINDAMYCIN HCL 300 MG PO CAPS
ORAL_CAPSULE | ORAL | Status: AC
  Filled 2019-07-15: qty 1

## 2019-07-15 MED ORDER — CLINDAMYCIN HCL 150 MG PO CAPS: 450.0000 mg | ORAL_CAPSULE | Freq: Three times a day (TID) | ORAL | 0 refills | Status: AC

## 2019-07-15 MED ORDER — CLINDAMYCIN HCL 300 MG PO CAPS
450.0000 mg | ORAL_CAPSULE | Freq: Once | ORAL | Status: AC
  Administered 2019-07-15: 450 mg via ORAL

## 2019-07-15 MED ORDER — CLINDAMYCIN HCL 150 MG PO CAPS
ORAL_CAPSULE | ORAL | Status: AC
  Filled 2019-07-15: qty 1

## 2019-07-19 LAB — CULTURE, BLOOD (ROUTINE X 2)
Culture: NO GROWTH
Culture: NO GROWTH
Special Requests: ADEQUATE
Special Requests: ADEQUATE

## 2020-06-17 IMAGING — CR DG FINGER THUMB 2+V*L*
3 series · 3 of 3 positions shown · non-contrast
Comparison: None.

CLINICAL DATA: Thumb injury 2 weeks ago.  Swelling, oozing

EXAM:
LEFT THUMB 2+V

[x finger pa left]
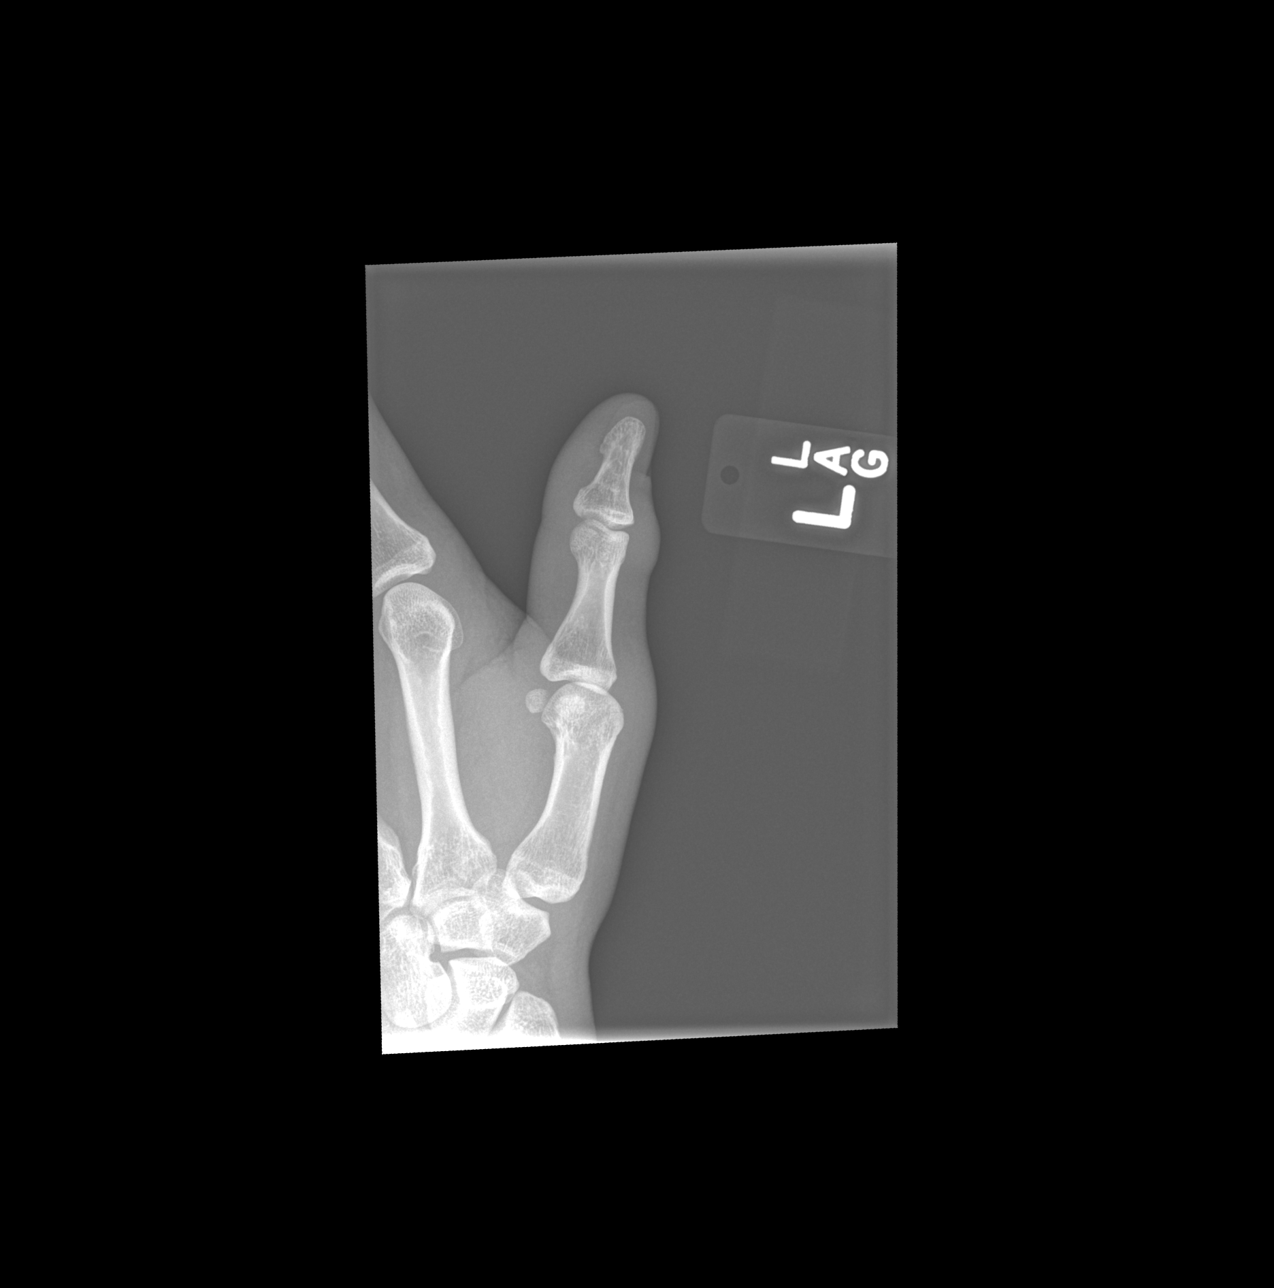

[x finger obl left]
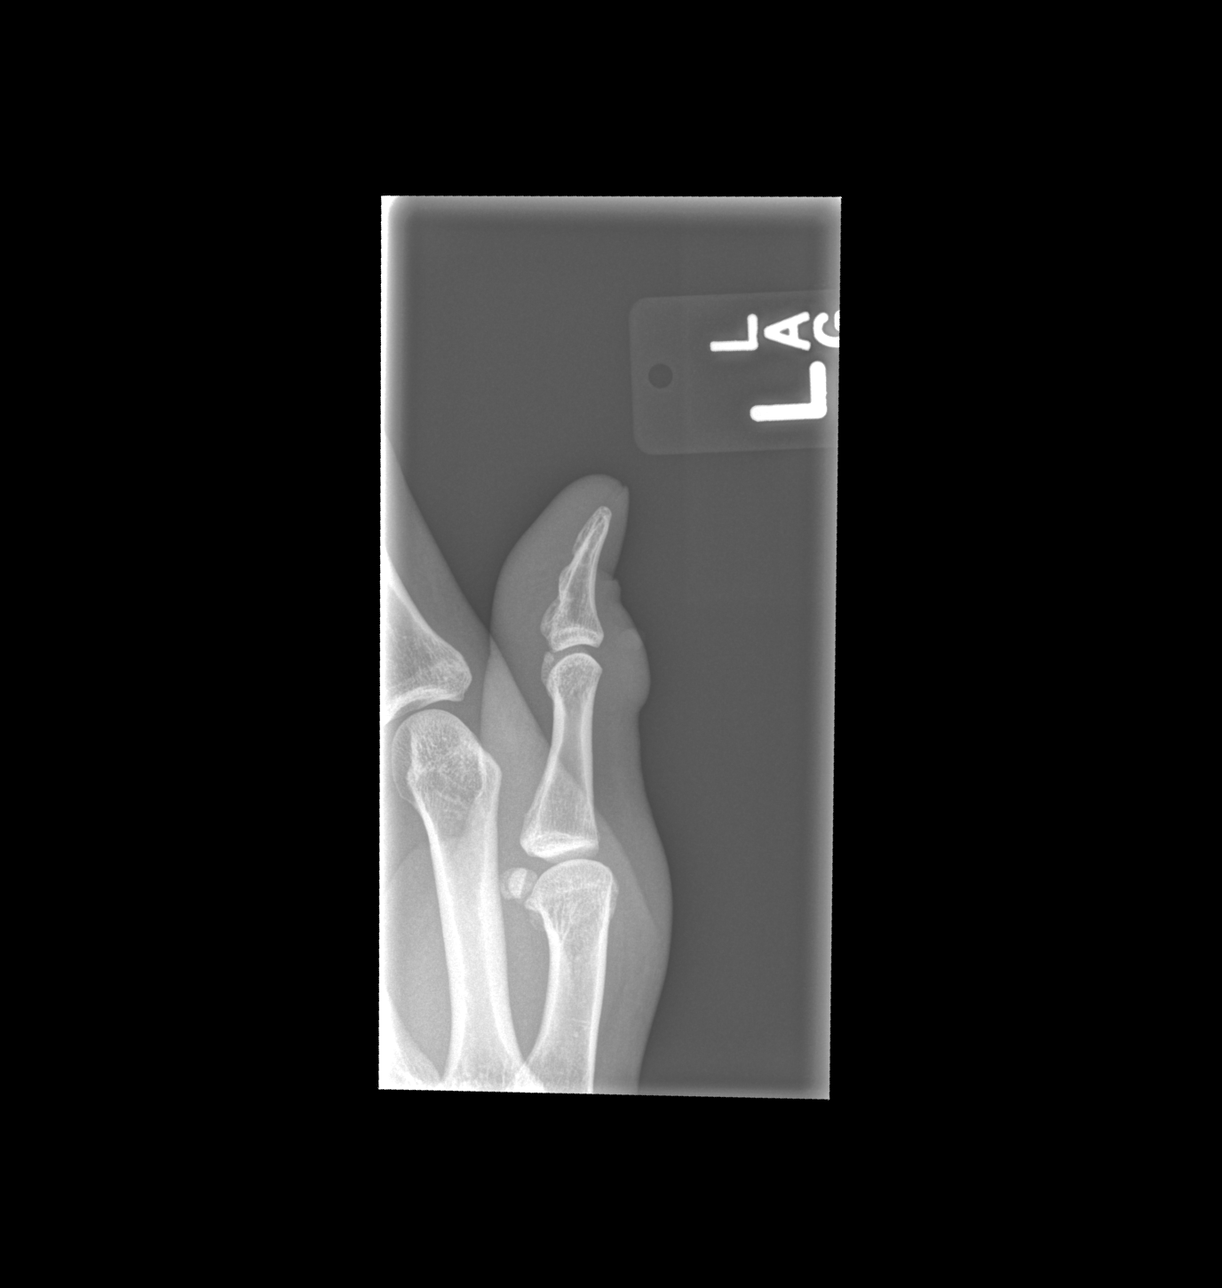

[x finger lat left]
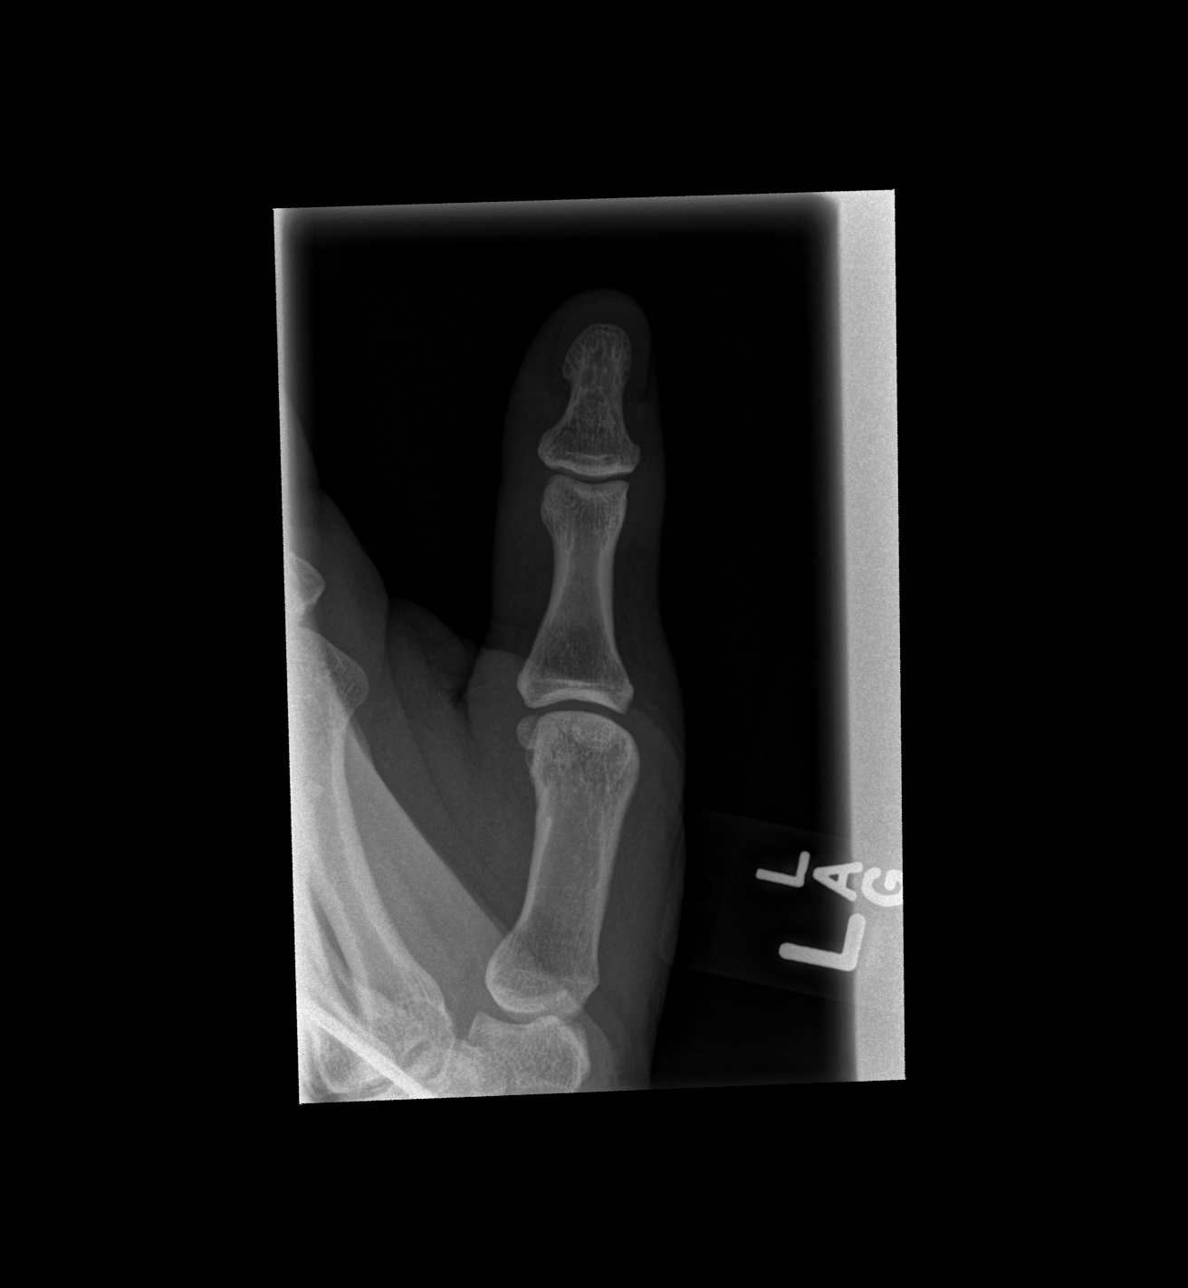

[3 of 3 positions shown; findings below may reference images not displayed]

FINDINGS: Soft tissue swelling. No fracture, subluxation or dislocation. Joint
spaces maintained. No radiopaque foreign bodies.
IMPRESSION: No acute bony abnormality.  Soft tissue swelling.
